# Patient Record
Sex: Male | Born: 1975 | Race: White | Hispanic: Yes | Marital: Married | Smoking: Never smoker
Health system: Southern US, Community
[De-identification: ages and names within clinical notes are randomized; demographics above are authoritative.]

## PROBLEM LIST (undated history)

## (undated) HISTORY — PX: NO PAST SURGERIES: SHX2092

---

## 2014-10-27 ENCOUNTER — Emergency Department (INDEPENDENT_AMBULATORY_CARE_PROVIDER_SITE_OTHER)
Admission: EM | Admit: 2014-10-27 | Discharge: 2014-10-27 | Disposition: A | Discharge status: Home / Self Care | Payer: Self-pay | Source: Home / Self Care | Attending: Family Medicine | Admitting: Family Medicine

## 2014-10-27 ENCOUNTER — Encounter (HOSPITAL_COMMUNITY): Payer: Self-pay | Admitting: Emergency Medicine

## 2014-10-27 DIAGNOSIS — G44219 Episodic tension-type headache, not intractable: Secondary | ICD-10-CM

## 2014-10-27 MED ORDER — NAPROXEN 375 MG PO TABS: 375.0000 mg | ORAL_TABLET | Freq: Two times a day (BID) | ORAL | Status: DC

## 2014-10-27 NOTE — Discharge Instructions (Signed)
Dolor de Pensions consultant, preguntas frecuentes y sus respuestas (Headaches, Frequently Asked Questions) CEFALEAS MIGRAOSAS P: Qu es la migraa? Qu la ocasiona? Cmo puedo tratarla? R: En general, la migraa comienza como un dolor apagado. Luego progresa hacia un dolor, constante, punzante y como un latido. Sentir Copy las sienes. Podr sentir Aeronautical engineer parte anterior o posterior de la cabeza, o en uno o ambos lados. El dolor suele estar acompaado de una combinacin de:  Nuseas.  Vmitos.  Sensibilidad a la luz y los ruidos. Algunas personas (un 15%) experimentan un aura (ver abajo) antes de un ataque. La causa de la migraa se debe a reacciones qumicas del cerebro. El tratamiento para la migraa puede incluir medicamentos de [place]. Tambin puede incluir tcnicas de [place]. Estas incluyen entrenamientos para la relajacin y biorretroalimentacin.  P: Qu es un aura? R: Alrededor del 15% de las personas con migraa tiene un "aura". Es una seal de sntomas neurolgicos que ocurren antes de un dolor de cabeza por migraas. Podr ver lneas onduladas o irregulares, puntos o luces parpadeantes. Podr experimentar visin de tnel o puntos ciegos en uno o ambos ojos. El aura puede incluir alucinaciones visuales o auditivas (algo que se imagina). Puede incluir trastornos en el olfato (como olores extraos), el tacto o el gusto. Entre otros sntomas se incluyen:  Adormecimiento.  Sensacin de hormigueo.  Dificultad para recordar o Tax adviser. Estos episodios neurolgicos pueden durar hasta 60 minutos. Los sntomas desaparecern a medida que el dolor de cabeza comience. P:Qu es un disparador? R: Ciertos factores fsicos o Best boy a "disparar" una migraa. Estos son:  Alimentos.  Cambios hormonales.  Clima.  Estrs. Es importante recordar que los disparadores son diferentes entre si. Para ayudar a prevenir ataques de migraas, necesitar  descubrir cules son los Engineer, civil (consulting). Lleve un diario sobre sus dolores de [place]. Este es un buen modo para descubrir los disparadores. El Visual merchandiser en el momento de hablar con el profesional acerca de su enfermedad. P: El clima afecta en las migraas? R: La luz solar, el calor, la humedad y lo cambios drsticos en la presin Doctor, hospital a, o "disparar" un ataque de migraa en [employer]. Pero estudios han demostrado que el clima no acta como disparador para todas las personas con [place]. P: Cul es la relacin entre la migraa y la hormonas? R: Las hormonas inician y [place] funciones corporales. Las hormonas [employer] balance en el cuerpo dentro de los constantes cambios de [place]. Algunas veces, el nivel de hormonas en el cuerpo se desbalancea. Por ejemplo, durante la menstruacin, el embarazo o la [place]. Pueden ser la causa de un ataque de migraa. De hecho, alrededor de tres cuartos de las mujeres con migraa informan que sus ataques estn relacionados con el ciclo menstrual.  P: Aumenta el riesgo de sufrir un choque cardaco en las personas que padecen migraa? R: La probabilidad de que un ataque de migraa ocasione un ataque cardaco es muy remota. Esto no quiere Google persona que sufre de migraa no pueda tener un ataque cardaco asociado con ella. En las personas menores de 40 aos, el factor ms comn para un ataque es la [place]. Pero durante la vida de una persona, la ocurrencia de un dolor de cabeza por migraa est asociada con una reduccin en el riesgo de morir por un ataque cerebrovascular.  P: Cules son los medicamentos para la migraa? R: La  medicación precisa se utiliza para tratar el dolor de cabeza una vez que ha comenzado. Son ejemplos, medicamentos de venta libre, desinflamatorios sin esteroides, ergotamínicos y triptanos.  °P: ¿Qué son los triptanos? °R: Lo triptanos son una nueva clase de  medicamentos abortivos. Son específicos para tratar este problema. Los triptanos son vasoconstrictores. Moderan algunas reacciones químicas del cerebro. Los triptanos trabajan como receptores del cerebro. Ayudan a restaurar el balance de un neurotransmisor denominado serotonina. Se cree que las fluctuaciones en los niveles de serotonina son la causa principal de la migraña.  °P: ¿Son efectivos los medicamentos de venta libre para la migraña? °R: Los medicamentos de venta libre pueden ser efectivos para aliviar dolores leves a moderados y los síntomas asociados a la migraña. Pero deberá consultar a un médico antes de comenzar cualquier tratamiento para la migraña.  °P: ¿Cuáles son los medicamentos de prevención de la migraña? °R: Se suele denominar tratamiento "profiláctico" a los medicamentos para la prevención de la migraña. Se utilizan para reducir la frecuencia, gravedad y duración de los ataques de migraña. Son ejemplos de medicamentos de prevención: antiepilépticos, antidepresivos, bloqueadores beta, bloqueadores de los canales de calcio y medicamentos antiinflamatorios sin esteroides. °P: ¿ Por qué se utilizan anticonvulsivantes para tratar la migraña? °R: Durante los últimos años, ha habido un creciente interés en las drogas antiepilépticas para la prevención de la migraña. A menudo se los conoce como "anticonvulsivantes". La epilepsia y la migraña suceden por reacciones similares en el cerebro.  °P: ¿ Por qué se utilizan antidepresivos para tratar la migraña? °R: Los antidepresivos típicamente se utilizan para tratar a las personas con depresión. Pueden reducir la frecuencia de la migraña a través de la regulación de los niveles químicos, como la serotonina, en el cerebro.  °P: ¿ Por qué se utilizan terapias alternativas para tratar la migraña? °R: El término "terapias alternativas" suelen utilizarse para describir los tratamientos que se considera que están por fuera de alcance la medicina occidental  convencional. Son ejemplos de las terapias alternativas: la acupuntura, la acupresión y el yoga. Otra terapia alternativa común es la terapia herbal. Se cree que algunas hierbas ayudan a aliviar los dolores de cabeza. Siempre consulte con el profesional acerca de las terapias alternativas antes de utilizarlas. Algunos productos herbales contienen arsénico y otras toxinas. °DOLORES DE CABEZA POR TENSIÓN °P: ¿Qué es un dolor de cabeza por tensión? ¿Qué lo ocasiona? ¿Cómo puedo tratarlo? °R: Los dolores de cabeza por tensión ocurren al azar. A menudo son el resultado de estrés temporario, ansiedad, fatiga o ira. Los síntomas incluyen dolor en las sienes, una sensación como de tener una banda alrededor de la cabeza (un dolor que "presiona"). Los síntomas pueden incluir una sensación de empuje, de presión y contracción de los músculos de la cabeza y el cuello. El dolor comienza en la frente, sienes o en la parte posterior de la cabeza y el cuello. El tratamiento para los dolores de cabeza por tensión puede incluir medicamentos de venta libre. También puede incluir técnicas de autoayuda con entrenamientos para la relajación y biorretroalimentación. °CEFALEA EN RACIMOS °P: ¿Qué es una cefalea en racimos? ¿Qué la ocasiona? ¿Cómo puedo tratarla? °R: La cefalea en racimos toma su nombre debido a que los ataques vienen en grupos. El dolor aparece con poco o ningún aviso. Normalmente ocurre de un lado de la cabeza. Muchas veces el dolor viene acompañado de un lagrimeo u ojo rojo y goteo de la nariz del mismo lado que el dolor. Se cree que la causa es   una reacción en las sustancias químicas del cerebro. Se describe como el caso más grave e intenso de cualquier tipo de dolor de cabeza. El tratamiento incluye medicamentos bajo receta y oxígeno. °CEFALEA SINUSAL °P: ¿Qué es una cefalea sinusal? ¿Qué la ocasiona? ¿Cómo puedo tratarla? °R: Cuando se inflama una cavidad en los huesos de la cara y el cráneo (sinus) ocasiona un dolor  localizado. Esta enfermedad generalmente es el resultado de una reacción alérgica, un tumor o una infección. Si el dolor de cabeza está ocasionado por un bloqueo del sinus, como una infección, probablemente tendrá fiebre. Una imagen de rayos X confirmará el bloqueo del sinus. El tratamiento indicado por el médico podrá incluir antibióticos para la infección, y también antihistamínicos o descongestivos.  °DOLOR DE CABEZA POR EFECTO "REBOTE" °P: ¿Qué es un dolor de cabeza por efecto "rebote"? ¿Qué lo ocasiona? ¿Cómo puedo tratarlo? °R: Si se toman medicamentos para el dolor de cabeza muy a menudo puede llevar a la enfermedad conocida como "dolor de cabeza por rebote". Un patrón de abuso de medicamentos para el dolor de cabeza supone tomarlos más de dos veces por semana o en cantidades excesivas. Esto significa más que lo que indica el envase o el médico. Con los dolores de cabeza por rebote, los medicamentos no sólo dejan de aliviar el dolor sino que además comienzan a ocasionar dolores de cabeza. Los médicos tratan los dolores de cabeza por rebote mediante la disminución del medicamento del que se ha abusado. A veces el medico podrá sustituir gradualmente por un tipo diferente de tratamiento o medicación. Dejar de consumirlo podría ser difícil. El abuso regular de un medicamento aumenta el potencial que se produzcan efectos secundarios graves. Consulte con un médico si utiliza regularmente medicamentos para el dolor de cabeza más de dos días por semana o más de lo que indica el envase. °PREGUNTAS Y RESPUESTAS ADICIONALES °P: ¿Qué es la biorretroalimentación? °R: La biorretroalimentación es un tratamiento de autoayuda. La biorretroalimentación utiliza un equipamiento especial para controlar los movimientos involuntarios del cuerpo y las respuestas físicas. La biorretroalimentación controla: °· Respiración. °· Pulso. °· Latidos cardíacos. °· Temperatura. °· Tensión muscular. °· Actividad cerebrales. °La  biorretroalimentación le ayudará a mejorar y perfeccionar sus ejercicios de relajación. Aprenderá a controlar las respuestas físicas relacionadas con el estrés. Una vez que se dominan las técnicas no necesitará más el equipamiento. °P: ¿Son hereditarios los dolores de cabeza? °R: Según algunas estimaciones, aproximadamente 28 millones de estadounidenses sufren migraña. Cuatro de cada cinco (80%) informan una historia familiar de migraña. Los investigadores no pueden asegurar si se trata de un problema genético o una predisposición familiar. A pesar de esto, un niño tiene 50% de probabilidades de sufrir migraña si uno de sus padres la sufre. El niño tiene un 75% de probabilidades si ambos padres la sufren.  °P. ¿Puede un niño tener migraña? °R: En el momento de ingresar a la escuela secundaria, la mayoría de los jóvenes han experimentado algún tipo de cefalea. Algunos abordajes o medicamentos seguros y efectivos pueden evitar las cefaleas o detenerlas luego de que han comenzado.  °P. ¿Qué tipo de especialista debe ver para diagnosticar y tratar una cefalea? °R: Comience con su médico de cabecera. Converse acerca de su experiencia y abordaje de las cefaleas. Comente los métodos de clasificación, diagnóstico y tratamiento. El profesional decidirá si lo derivará a un especialista, según los síntomas u otras enfermedades. El hecho de sufrir diabetes, alergias, etc, puede requerir un abordaje más complejo. La National Headache Foundation (Fundación Nacional   para las [address]as) proporcionar, a pedido, Agricultural engineeruna lista de los mdicos que son miembros de [place]. Document Released: 08/31/2008 Document Revised: 12/11/2011 [facility] Patient Information 2015 [place], [place]. This information is not intended to replace advice given to you by your health care provider. Make sure you discuss any questions you have with your health care provider.  Cefalea tensional  (Tension Headache)  Una cefalea tensional es una sensacin de  dolor y opresin en la frente y los lados de la cabeza. El dolor puede ser sordo o puede sentirse que comprime (constrictivo). Este es el tipo ms comn de dolor de Turkmenistancabeza. Generalmente no se asocian con nuseas o vmitos y no empeoran con la actividad fsica. Pueden durar desde 30 minutos a varios das.  CAUSAS  Se desconoce la causa exacta, pero puede ser causada por las sustancias qumicas y las hormonas del cerebro que producen dolor. Suelen comenzar despus de una situacin de estrs, ansiedad o por depresin. Otros desencadenantes pueden ser:   El alcohol.  Cafena (demasiada o abstinencia).  Infecciones respiratorias (resfriados, gripes, sinusitis).  Problemas dentales o apretar los dientes.  Fatiga.  Mantener la cabeza y el cuello en una posicin demasiado tiempo mientras utiliza un ordenador. SNTOMAS   Presin alrededor de la cabeza.   Dolor "sordo" en la cabeza.   Dolor que siente sobre la frente y los lados de la cabeza.   Sensibilidad en los msculos de la cabeza, del cuello y de los hombros. DIAGNSTICO  El diagnstico se realiza en base a:   Sntomas.   Examen fsico.   Neomia DearUna TC (tomografa computada) o resonancia magntica de la cabeza. Se pueden pedir estas pruebas, si los sntomas son severos o inusuales. TRATAMIENTO  Le recetarn medicamentos que ayudan a Asbury Automotive Groupaliviar los sntomas.  INSTRUCCIONES PARA EL CUIDADO EN EL HOGAR   Slo tome medicamentos de venta libre o recetados para Primary school teachercalmar el dolor o Environmental health practitionerel malestar, segn las indicaciones de su mdico.   Cuando sienta dolor de cabeza acustese en un cuarto oscuro y tranquilo.   Lleve un registro diario para Financial risk analystaveriguar lo que Southern Companypuede desencadenar los dolores de Turkmenistancabeza. Por ejemplo, escriba:  Lo que come y bebe.  Cunto tiempo duerme.  Todo cambio en la dieta o medicamentos.  Trate con masajes u otras tcnicas de relajacin.   Pueden utilizarse bolsas de hielo o calor aplicadas a la cabeza o al cuello.  selos 3 a 4 veces por da de 15 a 20 minutos por vez, o como sea necesario.   Limite las situaciones de estrs.   Sintese con la espalda recta y no tense los msculos.   Si fuma, deje de hacerlo.  Limite el consumo de bebidas alcohlicas.  Consuma menos cantidad de cafena o deje de tomarla.  Coma y haga ejercicios regularmente.  Duerma entre 7 y 9 horas o como lo indique su mdico.  Evite el uso excesivo de medicamentos para Investment banker, operationalel dolor como el dolor recurrente de cabeza que pueden ocurrir.  SOLICITE ATENCIN MDICA SI:   Tiene problemas con los Arboriculturistmedicamentos que le recetaron.  El medicamento no le hace efecto.  El dolor de cabeza que senta habitualmente es diferente.  Tiene nuseas o vmitos. SOLICITE ATENCIN MDICA DE INMEDIATO SI:   El dolor se hace cada vez ms intenso.  Tiene fiebre.  Presenta rigidez en el cuello.  Sufre prdida de la visin.  Presenta debilidad muscular o prdida del control muscular.  Pierde equilibrio o tiene problemas para Advertising account plannercaminar.  Sufre mareos o  se desmaya.  Tiene sntomas graves que son diferentes a los primeros sntomas. ASEGRESE DE QUE:   Comprende estas instrucciones.  Controlar su enfermedad.  Solicitar ayuda de inmediato si no mejora o si empeora. Document Released: 06/28/2005 Document Revised: 12/11/2011 [facility] Patient Information 2015 [place], [place]. This information is not intended to replace advice given to you by your health care provider. Make sure you discuss any questions you have with your health care provider.

## 2014-10-27 NOTE — ED Provider Notes (Signed)
CSN: [id number]     Arrival date & time 10/27/14  4098 History   First MD Initiated Contact with Patient 10/27/14 1004     Chief Complaint  Patient presents with  . Headache   (Consider location/radiation/quality/duration/timing/severity/associated sxs/prior Treatment) HPI Comments: Spouse states she was concerned that patient might have high blood pressure. She states that on occasion, when patient is having a headache, they will check his BP at automated machine at [facility] and will occasionally find it to be elevated. Reports himself to be symptom free at time of today's exam.  PCP: none Works in Building surveyor  Patient is a 39 y.o. male presenting with headaches. The history is provided by the patient and the spouse. The history is limited by a language barrier. A language interpreter was used.  Headache Pain location:  L temporal and R temporal Quality:  Dull (throbbing) Severity currently:  0/10 Duration:  8 days Timing:  Intermittent Progression:  Waxing and waning Chronicity:  Recurrent Similar to prior headaches: yes   Associated symptoms: sinus pressure   Associated symptoms: no abdominal pain, no back pain, no blurred vision, no congestion, no cough, no diarrhea, no dizziness, no drainage, no ear pain, no pain, no facial pain, no fatigue, no fever, no focal weakness, no hearing loss, no loss of balance, no myalgias, no nausea, no near-syncope, no neck pain, no neck stiffness, no numbness, no paresthesias, no photophobia, no seizures, no sore throat, no swollen glands, no syncope, no tingling, no URI, no visual change, no vomiting and no weakness     History reviewed. No pertinent past medical history. History reviewed. No pertinent past surgical history. History reviewed. No pertinent family history. History  Substance Use Topics  . Smoking status: Never Smoker   . Smokeless tobacco: Not on file  . Alcohol Use: No    Review of Systems  Constitutional:  Negative for fever and fatigue.  HENT: Positive for sinus pressure. Negative for congestion, ear discharge, ear pain, hearing loss, nosebleeds, postnasal drip, rhinorrhea and sore throat.   Eyes: Negative for blurred vision, photophobia, pain and visual disturbance.  Respiratory: Negative for cough, chest tightness and shortness of breath.   Cardiovascular: Negative.  Negative for syncope and near-syncope.  Gastrointestinal: Negative for nausea, vomiting, abdominal pain and diarrhea.  Musculoskeletal: Negative for myalgias, back pain, arthralgias, neck pain and neck stiffness.  Skin: Negative.   Neurological: Positive for headaches. Negative for dizziness, tremors, focal weakness, seizures, syncope, facial asymmetry, speech difficulty, weakness, light-headedness, numbness, paresthesias and loss of balance.    Allergies  Review of patient's allergies indicates no known allergies.  Home Medications   Prior to Admission medications   Medication Sig Start Date End Date Taking? Authorizing Provider  naproxen (NAPROSYN) 375 MG tablet Take 1 tablet (375 mg total) by mouth 2 (two) times daily with a meal. As needed for headaches 10/27/14   Jess Barters H Clements Toro, PA   BP 131/84 mmHg  Pulse 57  Temp(Src) 98.3 F (36.8 C) (Oral)  Resp 16  SpO2 96% Physical Exam  Constitutional: He is oriented to person, place, and time. He appears well-developed and well-nourished.  HENT:  Head: Normocephalic and atraumatic.  Right Ear: External ear normal.  Left Ear: External ear normal.  Nose: Nose normal.  Mouth/Throat: Oropharynx is clear and moist.  Eyes: Conjunctivae, EOM and lids are normal. Pupils are equal, round, and reactive to light.  Fundoscopic exam:      The right eye shows no  AV nicking, no hemorrhage and no papilledema.       The left eye shows no AV nicking, no hemorrhage and no papilledema.  Neck: Normal range of motion. Neck supple.  Cardiovascular: Normal rate, regular rhythm and  normal heart sounds.   Pulmonary/Chest: Effort normal and breath sounds normal.  Abdominal: Soft. Bowel sounds are normal. He exhibits no distension. There is no tenderness.  Musculoskeletal: Normal range of motion.  Neurological: He is alert and oriented to person, place, and time. He has normal strength. No cranial nerve deficit or sensory deficit. Coordination and gait normal. GCS eye subscore is 4. GCS verbal subscore is 5. GCS motor subscore is 6.  Reflex Scores:      Patellar reflexes are 2+ on the right side and 2+ on the left side. Skin: Skin is warm and dry. No rash noted. No erythema.  Psychiatric: He has a normal mood and affect. His behavior is normal.  Nursing note and vitals reviewed.   ED Course  Procedures (including critical care time) Labs Review Labs Reviewed - No data to display  Imaging Review No results found.   MDM   1. Episodic tension-type headache, not intractable    Exam without worrisome neurological deficits. Headaches are not associated with transient neurological deficits, nausea, vomiting, changes in vision, speech or balance. Is not awakened from sleep by headaches.  Hx and exam suggest bitemporal tension type headaches. Will suggest the use of either tylenol or ibuprofen when symptoms occur and [facility].    Ria ClockJennifer Lee H Caelynn Marshman, GeorgiaPA 10/27/14 1120

## 2014-10-27 NOTE — ED Notes (Signed)
Reports having a headache since last Tuesday.  C/o having pressure in the front and back of the head.  States that when heat is on in car feels sweaty, becomes pale, wife states "he starts to feel faint".  Denies n/v.  No hx of migraines.  No visual changes.    Mild relief with taking ibuprofen.   No runny nose or cough.

## 2015-08-24 ENCOUNTER — Ambulatory Visit: Payer: Self-pay | Admitting: Family Medicine

## 2022-01-15 ENCOUNTER — Encounter (HOSPITAL_COMMUNITY): Payer: Self-pay | Admitting: Emergency Medicine

## 2022-01-15 ENCOUNTER — Emergency Department (HOSPITAL_COMMUNITY): Payer: Self-pay

## 2022-01-15 ENCOUNTER — Emergency Department (HOSPITAL_COMMUNITY)
Admission: EM | Admit: 2022-01-15 | Discharge: 2022-01-15 | Disposition: A | Discharge status: Home / Self Care | Payer: Self-pay | Attending: Emergency Medicine | Admitting: Emergency Medicine

## 2022-01-15 DIAGNOSIS — K429 Umbilical hernia without obstruction or gangrene: Secondary | ICD-10-CM | POA: Insufficient documentation

## 2022-01-15 LAB — CBC WITH DIFFERENTIAL/PLATELET
Abs Immature Granulocytes: 0.02 10*3/uL (ref 0.00–0.07)
Basophils Absolute: 0.1 10*3/uL (ref 0.0–0.1)
Basophils Relative: 1 %
Eosinophils Absolute: 0.5 10*3/uL (ref 0.0–0.5)
Eosinophils Relative: 5 %
HCT: 45.9 % (ref 39.0–52.0)
Hemoglobin: 16.2 g/dL (ref 13.0–17.0)
Immature Granulocytes: 0 %
Lymphocytes Relative: 35 %
Lymphs Abs: 3 10*3/uL (ref 0.7–4.0)
MCH: 30.4 pg (ref 26.0–34.0)
MCHC: 35.3 g/dL (ref 30.0–36.0)
MCV: 86.1 fL (ref 80.0–100.0)
Monocytes Absolute: 0.8 10*3/uL (ref 0.1–1.0)
Monocytes Relative: 9 %
Neutro Abs: 4.2 10*3/uL (ref 1.7–7.7)
Neutrophils Relative %: 50 %
Platelets: 309 10*3/uL (ref 150–400)
RBC: 5.33 MIL/uL (ref 4.22–5.81)
RDW: 13 % (ref 11.5–15.5)
WBC: 8.5 10*3/uL (ref 4.0–10.5)
nRBC: 0 % (ref 0.0–0.2)

## 2022-01-15 LAB — BASIC METABOLIC PANEL
Anion gap: 5 (ref 5–15)
BUN: 21 mg/dL — ABNORMAL HIGH (ref 6–20)
CO2: 25 mmol/L (ref 22–32)
Calcium: 9.1 mg/dL (ref 8.9–10.3)
Chloride: 109 mmol/L (ref 98–111)
Creatinine, Ser: 0.94 mg/dL (ref 0.61–1.24)
GFR, Estimated: >60 mL/min (ref >60)
Glucose, Bld: 83 mg/dL (ref 70–99)
Potassium: 3.7 mmol/L (ref 3.5–5.1)
Sodium: 139 mmol/L (ref 135–145)

## 2022-01-15 MED ORDER — LACTATED RINGERS IV SOLN: INTRAVENOUS | Status: DC

## 2022-01-15 MED ORDER — OXYCODONE-ACETAMINOPHEN 5-325 MG PO TABS
2.0000 | ORAL_TABLET | Freq: Once | ORAL | Status: AC
  Administered 2022-01-15: 2 via ORAL
  Filled 2022-01-15: qty 2

## 2022-01-15 MED ORDER — SODIUM CHLORIDE (PF) 0.9 % IJ SOLN
INTRAMUSCULAR | Status: AC
  Filled 2022-01-15: qty 50

## 2022-01-15 MED ORDER — OXYCODONE-ACETAMINOPHEN 5-325 MG PO TABS: 1.0000 | ORAL_TABLET | Freq: Four times a day (QID) | ORAL | 0 refills | Status: DC | PRN

## 2022-01-15 MED ORDER — IOHEXOL 300 MG/ML  SOLN
100.0000 mL | Freq: Once | INTRAMUSCULAR | Status: AC | PRN
  Administered 2022-01-15: 100 mL via INTRAVENOUS

## 2022-01-15 NOTE — ED Provider Triage Note (Signed)
Emergency Medicine Provider Triage Evaluation Note ? ?David Mckinney , a 46 y.o. male  was evaluated in triage.  Pt complains of a painful umbilical henia ? ?Review of Systems  ?Positive: Pain in abdomen ?Negative: No fever  ? ?Physical Exam  ?BP 121/86 (BP Location: Right Arm)   Pulse 71   Temp 98.4 ?F (36.9 ?C) (Oral)   Resp 18   SpO2 96%  ?Gen:   Awake, no distress   ?Resp:  Normal effort  ?MSK:   Moves extremities without difficulty  ?Other:  Abdomen  tender firm hernia at umbilicus  ? ?Medical Decision Making  ?Medically screening exam initiated at 3:40 PM.  Appropriate orders placed.  David Mckinney was informed that the remainder of the evaluation will be completed by another provider, this initial triage assessment does not replace that evaluation, and the importance of remaining in the ED until their evaluation is complete. ? ?I asked that pt be roomed to a stretch bed, ice pack to area  ?  ?Elson Areas, [place] ?01/15/22 1541 ? ?

## 2022-01-15 NOTE — ED Provider Notes (Signed)
?[facility]-EMERGENCY DEPT ?Provider Note ? ? ?CSN: [id number] ?Arrival date & time: 01/15/22  1457 ? ?  ? ?History ? ?Chief Complaint  ?Patient presents with  ? Abdominal Pain  ? ? ?Eliceo Gladu Ackers is a 46 y.o. male. ? ?46 year old male presents with umbilical pain x3 days.  Denies any associated emesis or fever.  States normally he can push in his hernia but cannot do it currently.  Complains of severe pain that is worse with any movement.  No prior surgical history in that area.  An interpreter was used for this encounter ? ? ?  ? ?Home Medications ?Prior to Admission medications   ?Medication Sig Start Date End Date Taking? Authorizing Provider  ?naproxen (NAPROSYN) 375 MG tablet Take 1 tablet (375 mg total) by mouth 2 (two) times daily with a meal. As needed for headaches 10/27/14   Presson, Mathis Fare, PA  ?   ? ?Allergies    ?Patient has no known allergies.   ? ?Review of Systems   ?Review of Systems  ?All other systems reviewed and are negative. ? ?Physical Exam ?Updated Vital Signs ?BP 121/86 (BP Location: Right Arm)   Pulse 71   Temp 98.4 ?F (36.9 ?C) (Oral)   Resp 18   SpO2 96%  ?Physical Exam ?Vitals and nursing note reviewed.  ?Constitutional:   ?   General: He is not in acute distress. ?   Appearance: Normal appearance. He is well-developed. He is not toxic-appearing.  ?HENT:  ?   Head: Normocephalic and atraumatic.  ?Eyes:  ?   General: Lids are normal.  ?   Conjunctiva/sclera: Conjunctivae normal.  ?   Pupils: Pupils are equal, round, and reactive to light.  ?Neck:  ?   Thyroid: No thyroid mass.  ?   Trachea: No tracheal deviation.  ?Cardiovascular:  ?   Rate and Rhythm: Normal rate and regular rhythm.  ?   Heart sounds: Normal heart sounds. No murmur heard. ?  No gallop.  ?Pulmonary:  ?   Effort: Pulmonary effort is normal. No respiratory distress.  ?   Breath sounds: Normal breath sounds. No stridor. No decreased breath sounds, wheezing, rhonchi or rales.   ?Abdominal:  ?   General: There is no distension.  ?   Palpations: Abdomen is soft.  ?   Tenderness: There is no abdominal tenderness. There is no rebound.  ? ? ?Musculoskeletal:     ?   General: No tenderness. Normal range of motion.  ?   Cervical back: Normal range of motion and neck supple.  ?Skin: ?   General: Skin is warm and dry.  ?   Findings: No abrasion or rash.  ?Neurological:  ?   Mental Status: He is alert and oriented to person, place, and time. Mental status is at baseline.  ?   GCS: GCS eye subscore is 4. GCS verbal subscore is 5. GCS motor subscore is 6.  ?   Cranial Nerves: No cranial nerve deficit.  ?   Sensory: No sensory deficit.  ?   Motor: Motor function is intact.  ?Psychiatric:     ?   Attention and Perception: Attention normal.     ?   Speech: Speech normal.     ?   Behavior: Behavior normal.  ? ? ?ED Results / Procedures / Treatments   ?Labs ?(all labs ordered are listed, but only abnormal results are displayed) ?Labs Reviewed - No data to display ? ?EKG ?None ? ?  Radiology ?No results found. ? ?Procedures ?Procedures  ? ? ?Medications Ordered in ED ?Medications - No data to display ? ?ED Course/ Medical Decision Making/ A&P ?  ?                        ?Medical Decision Making ?Amount and/or Complexity of Data Reviewed ?Labs: ordered. ?Radiology: ordered. ? ?Risk ?Prescription drug management. ? ? ?Attempted to manually reduce hernia without success.  CT scan shows an umbilical hernia containing fat.  No evidence of bowel obstruction.  Discussed with Dr. Cliffton Asters from general surgery.  He will see the patient in the office this week. ? ? ? ? ? ? ? ?Final Clinical Impression(s) / ED Diagnoses ?Final diagnoses:  ?None  ? ? ?Rx / DC Orders ?ED Discharge Orders   ? ? None  ? ?  ? ? ?  ?Lorre Nick, MD ?01/15/22 2112 ? ?

## 2022-01-15 NOTE — ED Notes (Signed)
Ice pack provided to patient per PA request. ?

## 2022-01-15 NOTE — ED Triage Notes (Signed)
Patient c/o pain at belly button since last night. Denies N/V/D. States he is concerned for hernia.  ? ?Triage completed using interpreter Violet [phone]. ?

## 2022-01-15 NOTE — ED Notes (Signed)
Patient currently in CT °

## 2022-02-10 ENCOUNTER — Ambulatory Visit: Payer: Self-pay | Admitting: Surgery

## 2022-02-10 NOTE — H&P (Signed)
David Mckinney [id number]    Referring Provider:  Room, Emergency     Subjective    Chief Complaint: New Consultation (Umb. Hernia )       History of Present Illness:    Exceptionally pleasant 45-year-old male with no known medical problems who was referred by the emergency department after presenting with a 3-day history of umbilical pain in mid April.  No associated emesis or fever.  Note umbilical hernia which was typically reducible but had become incarcerated at that time.  Pain worse with movement.  No previous abdominal surgery.  He did have a CT scan confirming a fat-containing incarcerated umbilical hernia with a narrow fascial defect about 8 mm.  Incidentally noted hepatic steatosis and simple hepatic and renal cysts, colonic diverticulosis. He states his hernia has been present for about 15 years, but has never bothered him until now.  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     Review of Systems: A complete review of systems was obtained from the patient.  I have reviewed this information and discussed as appropriate with the patient.  See HPI as well for other ROS.     Medical History: History reviewed. No pertinent past medical history.   There is no problem list on file for this patient.     History reviewed. No pertinent surgical history.    No Known Allergies   No current outpatient medications on file prior to visit.    No current facility-administered medications on file prior to visit.      No family history on file.    Social History       Tobacco Use  Smoking Status Never  Smokeless Tobacco Never      Social History        Socioeconomic History   Marital status: Married  Tobacco Use   Smoking status: Never   Smokeless tobacco: Never  Substance and Sexual Activity   Alcohol use: Not Currently   Drug use: Never      Objective:          Vitals:    02/10/22 1544  BP: 120/70  Pulse: 71  Temp: 36.2 C (97.2 F)  SpO2: 98%  Weight: 75 kg (165 lb 6.4 oz)  Height: 156.2 cm (5' 1.5")    Body mass index is 30.75 kg/m.   A&Ox3 Unlabored respirations Abdomen soft and nontender.  There is a partially reducible but essentially incarcerated fat-containing umbilical hernia.  This is mildly tender.  Fascial defect is not palpable.   Assessment and Plan:  Diagnoses and all orders for this visit:   Umbilical hernia without obstruction and without gangrene     Recommended open repair, discussed technique of surgery and risks of bleeding, infection, pain, scarring, injury to intra-abdominal structures, wound healing problems, hematoma/seroma, hernia recurrence, possible use of mesh, as well as systemic cardiovascular/pulmonary/thromboembolic complications.  Discussed 6 weeks of restricted lifting postop.  Questions welcomed and answered.  We will schedule the patient's convenience.     David Mckinney David Maxwell Lemen, MD   

## 2022-03-03 NOTE — Patient Instructions (Addendum)
DUE TO COVID-19 ONLY TWO VISITORS  (aged 47 and older)  ARE ALLOWED TO COME WITH YOU AND STAY IN THE WAITING ROOM ONLY DURING PRE OP AND PROCEDURE.    **NO VISITORS ARE ALLOWED IN THE SHORT STAY AREA OR RECOVERY ROOM!!**   IF YOU WILL BE ADMITTED INTO THE HOSPITAL YOU ARE ALLOWED ONLY FOUR SUPPORT PEOPLE DURING VISITATION HOURS ONLY (7 AM -8PM)   The support person(s) must pass our screening, gel in and out, and wear a mask at all times, including in the patient's room. Patients must also wear a mask when staff or their support person are in the room. Visitors GUEST BADGE MUST BE WORN VISIBLY  One adult visitor may remain with you overnight and MUST be in the room by 8 P.M.     Your procedure is scheduled on: 03/17/22   Report to [facility] Main Entrance    Report to admitting at   6:45 AM   Call this number if you have problems the morning of surgery [phone]     Do not eat food or drink :After Midnight.             If you have questions, please contact your surgeon's office.   Oral Hygiene is also important to reduce your risk of infection.                                    Remember - BRUSH YOUR TEETH THE MORNING OF SURGERY WITH YOUR REGULAR TOOTHPASTE   Do NOT smoke after Midnight   Take these medicines the morning of surgery with A SIP OF WATER: none                                You may not have any metal on your body including  jewelry, and body piercing             Do not wear lotions, powders, cologne, or deodorant              Men may shave face and neck.   Do not bring valuables to the hospital. [facility] IS NOT             RESPONSIBLE   FOR VALUABLES.   Contacts, dentures or bridgework may not be worn into surgery.      Patients discharged on the day of surgery will not be allowed to drive home.  Someone NEEDS to stay with you for the first 24 hours after anesthesia.                 Please read over the following fact sheets you were  given:   IF YOU HAVE QUESTIONS ABOUT YOUR PRE-OP INSTRUCTIONS PLEASE CALL [phone]       [facility] - Preparing for Surgery Before surgery, you can play an important role.  Because skin is not sterile, your skin needs to be as free of germs as possible.  You can reduce the number of germs on your skin by washing with CHG (chlorahexidine gluconate) soap before surgery.  CHG is an antiseptic cleaner which kills germs and bonds with the skin to continue killing germs even after washing. Please DO NOT use if you have an allergy to CHG or antibacterial soaps.  If your skin becomes reddened/irritated stop using the CHG and inform your  nurse when you arrive at Short Stay. You may shave your face/neck. Please follow these instructions carefully:  1.  Shower with CHG Soap the night before surgery and the  morning of Surgery.  2.  If you choose to wash your hair, wash your hair first as usual with your  normal  shampoo.  3.  After you shampoo, rinse your hair and body thoroughly to remove the  shampoo.                            4.  Use CHG as you would any other liquid soap.  You can apply chg directly  to the skin and wash                       Gently with a scrungie or clean washcloth.  5.  Apply the CHG Soap to your body ONLY FROM THE NECK DOWN.   Do not use on face/ open                           Wound or open sores. Avoid contact with eyes, ears mouth and genitals (private parts).                       Wash face,  Genitals (private parts) with your normal soap.             6.  Wash thoroughly, paying special attention to the area where your surgery  will be performed.  7.  Thoroughly rinse your body with warm water from the neck down.  8.  DO NOT shower/wash with your normal soap after using and rinsing off  the CHG Soap.             9.  Pat yourself dry with a clean towel.            10.  Wear clean pajamas.            11.  Place clean sheets on your bed the night of your first shower and do  not  sleep with pets.  Day of Surgery : Do not apply any lotions/deodorants the morning of surgery.  Please wear clean clothes to the hospital/surgery center.     FAILURE TO FOLLOW THESE INSTRUCTIONS MAY RESULT IN THE CANCELLATION OF YOUR SURGERY    ________________________________________________________________________

## 2022-03-07 ENCOUNTER — Encounter (HOSPITAL_COMMUNITY): Payer: Self-pay

## 2022-03-07 ENCOUNTER — Other Ambulatory Visit: Payer: Self-pay

## 2022-03-07 ENCOUNTER — Encounter (HOSPITAL_COMMUNITY)
Admission: RE | Admit: 2022-03-07 | Discharge: 2022-03-07 | Disposition: A | Discharge status: Home / Self Care | Payer: Self-pay | Source: Ambulatory Visit | Attending: Surgery | Admitting: Surgery

## 2022-03-07 DIAGNOSIS — Z01812 Encounter for preprocedural laboratory examination: Secondary | ICD-10-CM | POA: Insufficient documentation

## 2022-03-07 DIAGNOSIS — Z01818 Encounter for other preprocedural examination: Secondary | ICD-10-CM

## 2022-03-07 LAB — CBC
HCT: 43.9 % (ref 39.0–52.0)
Hemoglobin: 15.1 g/dL (ref 13.0–17.0)
MCH: 29.6 pg (ref 26.0–34.0)
MCHC: 34.4 g/dL (ref 30.0–36.0)
MCV: 86.1 fL (ref 80.0–100.0)
Platelets: 320 10*3/uL (ref 150–400)
RBC: 5.1 MIL/uL (ref 4.22–5.81)
RDW: 12.7 % (ref 11.5–15.5)
WBC: 9.6 10*3/uL (ref 4.0–10.5)
nRBC: 0 % (ref 0.0–0.2)

## 2022-03-07 NOTE — Progress Notes (Addendum)
For Short Stay: COVID SWAB appointment date: Date of COVID positive in last 90 days:  Bowel Prep reminder:   For Anesthesia: PCP - NO PCP Cardiologist -   Chest x-ray -  EKG -  Stress Test -  ECHO -  Cardiac Cath -  Pacemaker/ICD device last checked: Pacemaker orders received: Device Rep notified:  Spinal Cord Stimulator:  Sleep Study -  CPAP -   Fasting Blood Sugar -  Checks Blood Sugar _____ times a day Date and result of last Hgb A1c-  Blood Thinner Instructions: Aspirin Instructions: Last Dose:  Activity level: Can go up a flight of stairs and activities of daily living without stopping and without chest pain and/or shortness of breath   Able to exercise without chest pain and/or shortness of breath   Unable to go up a flight of stairs without chest pain and/or shortness of breath     Anesthesia review:   Patient denies shortness of breath, fever, cough and chest pain at PAT appointment   Patient verbalized understanding of instructions that were given to them at the PAT appointment. Patient was also instructed that they will need to review over the PAT instructions again at home before surgery.  

## 2022-03-10 ENCOUNTER — Other Ambulatory Visit (HOSPITAL_COMMUNITY): Payer: Self-pay

## 2022-03-16 NOTE — Anesthesia Preprocedure Evaluation (Addendum)
Anesthesia Evaluation  Patient identified by MRN, date of birth, ID band Patient awake    Reviewed: Allergy & Precautions, NPO status , Patient's Chart, lab work & pertinent test results  Airway Mallampati: I  TM Distance: >3 FB Neck ROM: Full    Dental no notable dental hx. (+) Teeth Intact, Dental Advisory Given   Pulmonary neg pulmonary ROS,    Pulmonary exam normal breath sounds clear to auscultation       Cardiovascular Exercise Tolerance: Poor Normal cardiovascular exam Rhythm:Regular Rate:Normal     Neuro/Psych negative neurological ROS     GI/Hepatic Neg liver ROS,   Endo/Other    Renal/GU      Musculoskeletal   Abdominal (+) + obese (BMI 31.99),   Peds  Hematology Lab Results      Component                Value               Date                      WBC                      9.6                 03/07/2022                HGB                      15.1                03/07/2022                HCT                      43.9                03/07/2022                MCV                      86.1                03/07/2022                PLT                      320                 03/07/2022              Anesthesia Other Findings NKA  Reproductive/Obstetrics                            Anesthesia Physical Anesthesia Plan  ASA: 1  Anesthesia Plan: General   Post-op Pain Management: Toradol IV (intra-op)* and Precedex   Induction: Intravenous  PONV Risk Score and Plan: 3 and Treatment may vary due to age or medical condition, Midazolam and Ondansetron  Airway Management Planned: LMA  Additional Equipment: None  Intra-op Plan:   Post-operative Plan:   Informed Consent: I have reviewed the patients History and Physical, chart, labs and discussed the procedure including the risks, benefits and alternatives for the proposed anesthesia with the patient or authorized representative  who has indicated his/her understanding and acceptance.  Dental advisory given and Interpreter used for interveiw  Plan Discussed with: CRNA  Anesthesia Plan Comments: (Spanish Speaking)      Anesthesia Quick Evaluation

## 2022-03-17 ENCOUNTER — Ambulatory Visit (HOSPITAL_BASED_OUTPATIENT_CLINIC_OR_DEPARTMENT_OTHER): Payer: Self-pay | Admitting: Certified Registered Nurse Anesthetist

## 2022-03-17 ENCOUNTER — Ambulatory Visit (HOSPITAL_COMMUNITY)
Admission: RE | Admit: 2022-03-17 | Discharge: 2022-03-17 | Disposition: A | Discharge status: Home / Self Care | Payer: Self-pay | Attending: Surgery | Admitting: Surgery

## 2022-03-17 ENCOUNTER — Ambulatory Visit (HOSPITAL_COMMUNITY): Payer: Self-pay | Admitting: Certified Registered Nurse Anesthetist

## 2022-03-17 ENCOUNTER — Other Ambulatory Visit: Payer: Self-pay

## 2022-03-17 ENCOUNTER — Encounter (HOSPITAL_COMMUNITY)
Admission: RE | Disposition: A | Discharge status: Home / Self Care | Payer: Self-pay | Source: Home / Self Care | Attending: Surgery

## 2022-03-17 ENCOUNTER — Encounter (HOSPITAL_COMMUNITY): Payer: Self-pay | Admitting: Surgery

## 2022-03-17 DIAGNOSIS — K420 Umbilical hernia with obstruction, without gangrene: Secondary | ICD-10-CM

## 2022-03-17 DIAGNOSIS — Z01818 Encounter for other preprocedural examination: Secondary | ICD-10-CM

## 2022-03-17 HISTORY — PX: UMBILICAL HERNIA REPAIR: SHX196

## 2022-03-17 SURGERY — REPAIR, HERNIA, UMBILICAL, ADULT
Anesthesia: General

## 2022-03-17 MED ORDER — KETOROLAC TROMETHAMINE 30 MG/ML IJ SOLN
INTRAMUSCULAR | Status: DC | PRN
  Administered 2022-03-17: 15 mg via INTRAVENOUS

## 2022-03-17 MED ORDER — DOCUSATE SODIUM 100 MG PO CAPS: 100.0000 mg | ORAL_CAPSULE | Freq: Two times a day (BID) | ORAL | 0 refills | Status: AC

## 2022-03-17 MED ORDER — DEXMEDETOMIDINE (PRECEDEX) IN NS 20 MCG/5ML (4 MCG/ML) IV SYRINGE
PREFILLED_SYRINGE | INTRAVENOUS | Status: AC
  Filled 2022-03-17: qty 10

## 2022-03-17 MED ORDER — PROPOFOL 10 MG/ML IV BOLUS
INTRAVENOUS | Status: DC | PRN
  Administered 2022-03-17: 120 mg via INTRAVENOUS

## 2022-03-17 MED ORDER — FENTANYL CITRATE (PF) 100 MCG/2ML IJ SOLN
INTRAMUSCULAR | Status: DC | PRN
  Administered 2022-03-17 (×3): 50 ug via INTRAVENOUS

## 2022-03-17 MED ORDER — OXYCODONE HCL 5 MG PO TABS: 5.0000 mg | ORAL_TABLET | Freq: Once | ORAL | Status: DC | PRN

## 2022-03-17 MED ORDER — DEXAMETHASONE SODIUM PHOSPHATE 10 MG/ML IJ SOLN
INTRAMUSCULAR | Status: DC | PRN
  Administered 2022-03-17: 5 mg via INTRAVENOUS

## 2022-03-17 MED ORDER — LACTATED RINGERS IV SOLN: INTRAVENOUS | Status: DC

## 2022-03-17 MED ORDER — SODIUM CHLORIDE 0.9% FLUSH: 3.0000 mL | INTRAVENOUS | Status: DC | PRN

## 2022-03-17 MED ORDER — CHLORHEXIDINE GLUCONATE 0.12 % MT SOLN
15.0000 mL | Freq: Once | OROMUCOSAL | Status: AC
  Administered 2022-03-17: 15 mL via OROMUCOSAL

## 2022-03-17 MED ORDER — MIDAZOLAM HCL 5 MG/5ML IJ SOLN
INTRAMUSCULAR | Status: DC | PRN
  Administered 2022-03-17: 2 mg via INTRAVENOUS

## 2022-03-17 MED ORDER — 0.9 % SODIUM CHLORIDE (POUR BTL) OPTIME
TOPICAL | Status: DC | PRN
  Administered 2022-03-17: 1000 mL

## 2022-03-17 MED ORDER — SODIUM CHLORIDE 0.9% FLUSH: 3.0000 mL | Freq: Two times a day (BID) | INTRAVENOUS | Status: DC

## 2022-03-17 MED ORDER — HYDROMORPHONE HCL 1 MG/ML IJ SOLN
INTRAMUSCULAR | Status: AC
  Filled 2022-03-17: qty 1

## 2022-03-17 MED ORDER — FENTANYL CITRATE PF 50 MCG/ML IJ SOSY: 25.0000 ug | PREFILLED_SYRINGE | INTRAMUSCULAR | Status: DC | PRN

## 2022-03-17 MED ORDER — OXYCODONE HCL 5 MG/5ML PO SOLN: 5.0000 mg | Freq: Once | ORAL | Status: DC | PRN

## 2022-03-17 MED ORDER — KETOROLAC TROMETHAMINE 30 MG/ML IJ SOLN: 30.0000 mg | Freq: Once | INTRAMUSCULAR | Status: DC | PRN

## 2022-03-17 MED ORDER — CHLORHEXIDINE GLUCONATE 4 % EX LIQD: 60.0000 mL | Freq: Once | CUTANEOUS | Status: DC

## 2022-03-17 MED ORDER — ONDANSETRON HCL 4 MG/2ML IJ SOLN
INTRAMUSCULAR | Status: AC
  Filled 2022-03-17: qty 2

## 2022-03-17 MED ORDER — ACETAMINOPHEN 325 MG PO TABS: 650.0000 mg | ORAL_TABLET | ORAL | Status: DC | PRN

## 2022-03-17 MED ORDER — OXYCODONE HCL 5 MG PO TABS
ORAL_TABLET | ORAL | Status: AC
  Filled 2022-03-17: qty 2

## 2022-03-17 MED ORDER — PHENYLEPHRINE 80 MCG/ML (10ML) SYRINGE FOR IV PUSH (FOR BLOOD PRESSURE SUPPORT)
PREFILLED_SYRINGE | INTRAVENOUS | Status: DC | PRN
  Administered 2022-03-17 (×2): 80 ug via INTRAVENOUS

## 2022-03-17 MED ORDER — CEFAZOLIN SODIUM-DEXTROSE 2-4 GM/100ML-% IV SOLN
2.0000 g | INTRAVENOUS | Status: AC
  Administered 2022-03-17: 2 g via INTRAVENOUS
  Filled 2022-03-17: qty 100

## 2022-03-17 MED ORDER — HYDROMORPHONE HCL 1 MG/ML IJ SOLN: 0.2500 mg | INTRAMUSCULAR | Status: DC | PRN

## 2022-03-17 MED ORDER — ACETAMINOPHEN 500 MG PO TABS
1000.0000 mg | ORAL_TABLET | ORAL | Status: AC
  Administered 2022-03-17: 1000 mg via ORAL
  Filled 2022-03-17: qty 2

## 2022-03-17 MED ORDER — BUPIVACAINE-EPINEPHRINE 0.25% -1:200000 IJ SOLN
INTRAMUSCULAR | Status: DC | PRN
  Administered 2022-03-17: 30 mL

## 2022-03-17 MED ORDER — ONDANSETRON HCL 4 MG/2ML IJ SOLN: 4.0000 mg | Freq: Once | INTRAMUSCULAR | Status: DC | PRN

## 2022-03-17 MED ORDER — OXYCODONE HCL 5 MG PO TABS: 5.0000 mg | ORAL_TABLET | Freq: Three times a day (TID) | ORAL | 0 refills | Status: AC | PRN

## 2022-03-17 MED ORDER — MIDAZOLAM HCL 2 MG/2ML IJ SOLN
INTRAMUSCULAR | Status: AC
  Filled 2022-03-17: qty 2

## 2022-03-17 MED ORDER — ONDANSETRON HCL 4 MG/2ML IJ SOLN
INTRAMUSCULAR | Status: DC | PRN
  Administered 2022-03-17: 4 mg via INTRAVENOUS

## 2022-03-17 MED ORDER — DEXMEDETOMIDINE (PRECEDEX) IN NS 20 MCG/5ML (4 MCG/ML) IV SYRINGE
PREFILLED_SYRINGE | INTRAVENOUS | Status: DC | PRN
  Administered 2022-03-17: 12 ug via INTRAVENOUS

## 2022-03-17 MED ORDER — GABAPENTIN 300 MG PO CAPS
300.0000 mg | ORAL_CAPSULE | ORAL | Status: AC
  Administered 2022-03-17: 300 mg via ORAL
  Filled 2022-03-17: qty 1

## 2022-03-17 MED ORDER — DEXAMETHASONE SODIUM PHOSPHATE 10 MG/ML IJ SOLN
INTRAMUSCULAR | Status: AC
  Filled 2022-03-17: qty 1

## 2022-03-17 MED ORDER — OXYCODONE HCL 5 MG PO TABS: 5.0000 mg | ORAL_TABLET | ORAL | Status: DC | PRN

## 2022-03-17 MED ORDER — FENTANYL CITRATE (PF) 100 MCG/2ML IJ SOLN
INTRAMUSCULAR | Status: AC
  Filled 2022-03-17: qty 2

## 2022-03-17 MED ORDER — BUPIVACAINE LIPOSOME 1.3 % IJ SUSP: 20.0000 mL | Freq: Once | INTRAMUSCULAR | Status: DC

## 2022-03-17 MED ORDER — SODIUM CHLORIDE 0.9 % IV SOLN: 250.0000 mL | INTRAVENOUS | Status: DC | PRN

## 2022-03-17 MED ORDER — BUPIVACAINE LIPOSOME 1.3 % IJ SUSP
INTRAMUSCULAR | Status: DC | PRN
  Administered 2022-03-17: 20 mL

## 2022-03-17 MED ORDER — BUPIVACAINE LIPOSOME 1.3 % IJ SUSP
INTRAMUSCULAR | Status: AC
  Filled 2022-03-17: qty 20

## 2022-03-17 MED ORDER — BUPIVACAINE-EPINEPHRINE (PF) 0.25% -1:200000 IJ SOLN
INTRAMUSCULAR | Status: AC
  Filled 2022-03-17: qty 30

## 2022-03-17 MED ORDER — ACETAMINOPHEN 650 MG RE SUPP: 650.0000 mg | RECTAL | Status: DC | PRN

## 2022-03-17 MED ORDER — LIDOCAINE HCL (PF) 2 % IJ SOLN
INTRAMUSCULAR | Status: AC
  Filled 2022-03-17: qty 5

## 2022-03-17 MED ORDER — ORAL CARE MOUTH RINSE: 15.0000 mL | Freq: Once | OROMUCOSAL | Status: AC

## 2022-03-17 MED ORDER — LIDOCAINE 2% (20 MG/ML) 5 ML SYRINGE
INTRAMUSCULAR | Status: DC | PRN
  Administered 2022-03-17: 100 mg via INTRAVENOUS

## 2022-03-17 SURGICAL SUPPLY — 31 items
BAG COUNTER SPONGE SURGICOUNT (BAG) IMPLANT
BENZOIN TINCTURE PRP APPL 2/3 (GAUZE/BANDAGES/DRESSINGS) ×1 IMPLANT
CHLORAPREP W/TINT 26 (MISCELLANEOUS) ×2 IMPLANT
COVER SURGICAL LIGHT HANDLE (MISCELLANEOUS) ×2 IMPLANT
DRAPE LAPAROSCOPIC ABDOMINAL (DRAPES) ×2 IMPLANT
DRSG TEGADERM 4X4.75 (GAUZE/BANDAGES/DRESSINGS) ×1 IMPLANT
ELECT REM PT RETURN 15FT ADLT (MISCELLANEOUS) ×2 IMPLANT
GAUZE SPONGE 4X4 12PLY STRL (GAUZE/BANDAGES/DRESSINGS) ×1 IMPLANT
GLOVE BIO SURGEON STRL SZ 6 (GLOVE) ×2 IMPLANT
GLOVE INDICATOR 6.5 STRL GRN (GLOVE) ×2 IMPLANT
GLOVE SS BIOGEL STRL SZ 6 (GLOVE) ×1 IMPLANT
GLOVE SUPERSENSE BIOGEL SZ 6 (GLOVE) ×1
GOWN STRL REUS W/ TWL LRG LVL3 (GOWN DISPOSABLE) ×1 IMPLANT
GOWN STRL REUS W/ TWL XL LVL3 (GOWN DISPOSABLE) IMPLANT
GOWN STRL REUS W/TWL LRG LVL3 (GOWN DISPOSABLE) ×1
GOWN STRL REUS W/TWL XL LVL3 (GOWN DISPOSABLE)
KIT BASIN OR (CUSTOM PROCEDURE TRAY) ×2 IMPLANT
KIT TURNOVER KIT A (KITS) IMPLANT
NEEDLE HYPO 22GX1.5 SAFETY (NEEDLE) IMPLANT
PACK GENERAL/GYN (CUSTOM PROCEDURE TRAY) ×2 IMPLANT
PENCIL SMOKE EVACUATOR (MISCELLANEOUS) ×1 IMPLANT
SPIKE FLUID TRANSFER (MISCELLANEOUS) ×2 IMPLANT
STRIP CLOSURE SKIN 1/2X4 (GAUZE/BANDAGES/DRESSINGS) ×1 IMPLANT
SUT ETHIBOND 0 MO6 C/R (SUTURE) ×1 IMPLANT
SUT MNCRL AB 4-0 PS2 18 (SUTURE) ×2 IMPLANT
SUT PROLENE 2 0 CT2 30 (SUTURE) ×2 IMPLANT
SUT VIC AB 3-0 SH 27 (SUTURE) ×1
SUT VIC AB 3-0 SH 27XBRD (SUTURE) IMPLANT
SYR CONTROL 10ML LL (SYRINGE) IMPLANT
TOWEL OR 17X26 10 PK STRL BLUE (TOWEL DISPOSABLE) ×2 IMPLANT
TOWEL OR NON WOVEN STRL DISP B (DISPOSABLE) ×2 IMPLANT

## 2022-03-17 NOTE — Transfer of Care (Signed)
Immediate Anesthesia Transfer of Care Note  Patient: David Mckinney  Procedure(s) Performed: OPEN UMBILICAL HERNIA REPAIR  Patient Location: PACU  Anesthesia Type:General  Level of Consciousness: drowsy  Airway & Oxygen Therapy: Patient Spontanous Breathing and Patient connected to face mask oxygen  Post-op Assessment: Report given to RN and Post -op Vital signs reviewed and stable  Post vital signs: Reviewed and stable  Last Vitals:  Vitals Value Taken Time  BP 92/50 03/17/22 1025  Temp    Pulse 72 03/17/22 1026  Resp 17 03/17/22 1026  SpO2 95 % 03/17/22 1026  Vitals shown include unvalidated device data.  Last Pain:  Vitals:   03/17/22 0747  TempSrc:   PainSc: 0-No pain         Complications: No notable events documented.

## 2022-03-17 NOTE — Op Note (Signed)
Operative Note  David Mckinney [name]  [id number]  [id number]  03/17/2022   Surgeon: Phylliss Blakes MD FACS   Procedure performed: Open primary repair of incarcerated umbilical hernia, defect 8 mm   Preop diagnosis: Incarcerated umbilical hernia Post-op diagnosis/intraop findings: Same, containing preperitoneal fat   Specimens: no Retained items: no  EBL: minimal cc Complications: none   Description of procedure: After confirming informed consent the patient was taken to the operating room and placed supine on operating room table where general anesthesia was initiated, preoperative antibiotics were administered, SCDs applied, and a formal timeout was performed.  The abdomen was clipped, prepped and draped in usual sterile fashion.  After infiltration with local (Exparel mixed with quarter percent Marcaine with epinephrine), an infraumbilical curvilinear incision was made and the soft tissue dissected with cautery.  The umbilical stalk was encircled and the umbilical skin divided from the underlying hernia sac with cautery.  The hernia sac was skeletonized down to the level of the fascia where it was excised along with a small amount of chronically incarcerated preperitoneal fat.  The fascial defect was cleared off and the defect was measured at 8 mm in maximal diameter.  This was closed transversely with interrupted 0 Ethibonds.  Additional local was infiltrated in the fascia and soft tissue surrounding the repair.  Hemostasis was ensured within the wound.  The umbilical skin was sutured down to the fascia with a 3-0 Vicryl.  The skin was closed with running subcuticular 4 Monocryl and a light pressure dressing of gauze and Tegaderm was then applied. The patient was then awakened, extubated and taken to PACU in stable condition.    All counts were correct at the completion of the case.

## 2022-03-17 NOTE — Anesthesia Procedure Notes (Signed)
Procedure Name: LMA Insertion Date/Time: 03/17/2022 9:23 AM  Performed by: Wynonia Sours, CRNAPre-anesthesia Checklist: Patient identified, Emergency Drugs available, Suction available, Patient being monitored and Timeout performed Patient Re-evaluated:Patient Re-evaluated prior to induction Oxygen Delivery Method: Circle system utilized Preoxygenation: Pre-oxygenation with 100% oxygen Induction Type: IV induction LMA: LMA with gastric port inserted LMA Size: 4.0 Number of attempts: 1 Placement Confirmation: positive ETCO2 Tube secured with: Tape Dental Injury: Teeth and Oropharynx as per pre-operative assessment

## 2022-03-17 NOTE — H&P (Signed)
David Mckinney [id number]    Referring Provider:  Room, Emergency     Subjective    Chief Complaint: New Consultation (Umb. Hernia )       History of Present Illness:    Exceptionally pleasant 46 year old male with no known medical problems who was referred by the emergency department after presenting with a 3-day history of umbilical pain in mid April.  No associated emesis or fever.  Note umbilical hernia which was typically reducible but had become incarcerated at that time.  Pain worse with movement.  No previous abdominal surgery.  He did have a CT scan confirming a fat-containing incarcerated umbilical hernia with a narrow fascial defect about 8 mm.  Incidentally noted hepatic steatosis and simple hepatic and renal cysts, colonic diverticulosis. He states his hernia has been present for about 15 years, but has never bothered him until now.  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     Review of Systems: A complete review of systems was obtained from the patient.  I have reviewed this information and discussed as appropriate with the patient.  See HPI as well for other ROS.     Medical History: History reviewed. No pertinent past medical history.   There is no problem list on file for this patient.     History reviewed. No pertinent surgical history.    No Known Allergies   No current outpatient medications on file prior to visit.    No current facility-administered medications on file prior to visit.      No family history on file.    Social History       Tobacco Use  Smoking Status Never  Smokeless Tobacco Never      Social History        Socioeconomic History   Marital status: Married  Tobacco Use   Smoking status: Never   Smokeless tobacco: Never  Substance and Sexual Activity   Alcohol use: Not Currently   Drug use: Never      Objective:          Vitals:    02/10/22 1544  BP: 120/70  Pulse: 71  Temp: 36.2 C (97.2 F)  SpO2: 98%  Weight: 75 kg (165 lb 6.4 oz)  Height: 156.2 cm (5' 1.5")    Body mass index is 30.75 kg/m.   A&Ox3 Unlabored respirations Abdomen soft and nontender.  There is a partially reducible but essentially incarcerated fat-containing umbilical hernia.  This is mildly tender.  Fascial defect is not palpable.   Assessment and Plan:  Diagnoses and all orders for this visit:   Umbilical hernia without obstruction and without gangrene     Recommended open repair, discussed technique of surgery and risks of bleeding, infection, pain, scarring, injury to intra-abdominal structures, wound healing problems, hematoma/seroma, hernia recurrence, possible use of mesh, as well as systemic cardiovascular/pulmonary/thromboembolic complications.  Discussed 6 weeks of restricted lifting postop.  Questions welcomed and answered.  We will schedule the patient's convenience.     [name] [name], MD

## 2022-03-17 NOTE — Anesthesia Postprocedure Evaluation (Signed)
Anesthesia Post Note  Patient: David Mckinney  Procedure(s) Performed: OPEN UMBILICAL HERNIA REPAIR     Patient location during evaluation: PACU Anesthesia Type: General Level of consciousness: awake and alert Pain management: pain level controlled Vital Signs Assessment: post-procedure vital signs reviewed and stable Respiratory status: spontaneous breathing, nonlabored ventilation, respiratory function stable and patient connected to nasal cannula oxygen Cardiovascular status: blood pressure returned to baseline and stable Postop Assessment: no apparent nausea or vomiting Anesthetic complications: no   No notable events documented.  Last Vitals:  Vitals:   03/17/22 1045 03/17/22 1100  BP: (!) 91/54 104/63  Pulse: 69 69  Resp: 15 13  Temp:  36.4 C  SpO2: 98% 98%    Last Pain:  Vitals:   03/17/22 1115  TempSrc:   PainSc: 0-No pain                 Trevor Iha

## 2022-03-17 NOTE — Discharge Instructions (Addendum)
[facility] REPAIR: POST OP INSTRUCTIONS   EAT Gradually transition to a high fiber diet with a fiber supplement over the next few weeks after discharge.  Start with a pureed / full liquid diet (see below)  WALK Walk an hour a day (cumulative- not all at once).  Control your pain to do that.    CONTROL PAIN Control pain so that you can walk, sleep, tolerate sneezing/coughing, and go up/down stairs.  HAVE A BOWEL MOVEMENT DAILY Keep your bowels regular to avoid problems.  OK to try a laxative to override constipation.  OK to use an antidairrheal to slow down diarrhea.  Call if not better after 2 tries  CALL IF YOU HAVE PROBLEMS/CONCERNS Call if you are still struggling despite following these instructions. Call if you have concerns not answered by these instructions  ######################################################################    DIET: Follow a light bland diet & liquids the first 24 hours after arrival home, such as soup, liquids, starches, etc.  Be sure to drink plenty of fluids.  Quickly advance to a usual solid diet within a few days.  Avoid fast food or heavy meals as your are more likely to get nauseated or have irregular bowels.  A low-sugar, high-fiber diet for the rest of your life is ideal.   Take your usually prescribed home medications unless otherwise directed.  PAIN CONTROL: Pain is best controlled by a usual combination of three different methods TOGETHER: Ice/Heat Over the counter pain medication Prescription pain medication Most patients will experience some swelling and bruising around the hernia(s) such as the bellybutton, groins, or old incisions.  Ice packs or heating pads (30-60 minutes up to 6 times a day) will help. Use ice for the first few days to help decrease swelling and bruising, then switch to heat to help relax tight/sore spots and speed recovery.  Some people prefer to use ice alone, heat alone, alternating between ice & heat.  Experiment to what  works for you.  Swelling and bruising can take several weeks to resolve.   It is helpful to take an over-the-counter pain medication regularly for the first days: Naproxen (Aleve, etc)  Two 220mg tabs twice a day OR Ibuprofen (Advil, etc) Three 200mg tabs four times a day (every meal & bedtime) AND Acetaminophen (Tylenol, etc) 325-650mg four times a day (every meal & bedtime) A  prescription for pain medication should be given to you upon discharge.  Take your pain medication as prescribed, IF NEEDED.  If you are having problems/concerns with the prescription medicine (does not control pain, nausea, vomiting, rash, itching, etc), please call us [phone] to see if we need to switch you to a different pain medicine that will work better for you and/or control your side effect better. If you need a refill on your pain medication, please contact your pharmacy.  They will contact our office to request authorization. Prescriptions will not be filled after 5 pm or on week-ends.  Avoid getting constipated.  Between the surgery and the pain medications, it is common to experience some constipation.  Increasing fluid intake and taking a fiber supplement (such as Metamucil, Citrucel, FiberCon, MiraLax, etc) 1-2 times a day regularly will usually help prevent this problem from occurring.  A mild laxative (prune juice, Milk of Magnesia, MiraLax, etc) should be taken according to package directions if there are no bowel movements after 48 hours.    Wash / shower every day, starting 2 days after surgery.  You may shower over the   steri strips which are waterproof.    Remove your outer bandage 2 days after surgery. Steri strips will peel off after 1-2 weeks.  You may leave the incision open to air.  You may replace a dressing/Band-Aid to cover an incision for comfort if you wish.  Continue to shower over incision(s) after the dressing is off.  ACTIVITIES as tolerated:   You may resume regular (light) daily  activities beginning the next day--such as daily self-care, walking, climbing stairs--gradually increasing activities as tolerated.  Control your pain so that you can walk an hour a day.  If you can walk 30 minutes without difficulty, it is safe to try more intense activity such as jogging, treadmill, bicycling, low-impact aerobics, swimming, etc. Refrain from the most intensive and strenuous activity such as sit-ups, heavy lifting, contact sports, etc  Refrain from any heavy lifting or straining until 6 weeks after surgery.   It is best if you can remain out of work and rest for at least 2 weeks after surgery, and when you go back do not lift anything greater than 20lb for another 4 weeks.  DO NOT PUSH THROUGH PAIN.  Let pain be your guide: If it hurts to do something, don't do it.  Pain is your body warning you to avoid that activity for another week until the pain goes down. You may drive when you are no longer taking prescription pain medication, you can comfortably wear a seatbelt, and you can safely maneuver your car and apply brakes. You may have sexual intercourse when it is comfortable.   FOLLOW UP in our office Please call [facility] at [phone] to set up an appointment to see your surgeon in the office for a follow-up appointment approximately 2-3 weeks after your surgery. Make sure that you call for this appointment the day you arrive home to insure a convenient appointment time.  9.  If you have disability of FMLA / Family leave forms, please bring the forms to the office for processing.  (do not give to your surgeon).  WHEN TO CALL us [phone]: Poor pain control Reactions / problems with new medications (rash/itching, nausea, etc)  Fever over 101.5 F (38.5 C) Inability to urinate Nausea and/or vomiting Worsening swelling or bruising Continued bleeding from incision. Increased pain, redness, or drainage from the incision   The clinic staff is available to answer your  questions during regular business hours (8:30am-5pm).  Please don't hesitate to call and ask to speak to one of our nurses for clinical concerns.   If you have a medical emergency, go to the nearest emergency room or call 911.  A surgeon from [facility] is always on call at the hospitals in [facility], [place] [address], [address], [place], [place]  [address] ?  [address] [address], [place], [place]   [address] MAIN: [phone] ? TOLL FREE: [phone] ? FAX: [phone] [email or web address]

## 2022-03-18 ENCOUNTER — Encounter (HOSPITAL_COMMUNITY): Payer: Self-pay | Admitting: Surgery

## 2023-03-31 IMAGING — CT CT ABD-PELV W/ CM
2 of 5 series · 15 of 46 positions shown, 17 images · IV contrast (agent unspecified)
Comparison: None.

CLINICAL DATA: Epigastric pain and nausea.

EXAM:
CT ABDOMEN AND PELVIS WITH CONTRAST
TECHNIQUE: Multidetector CT imaging of the abdomen and pelvis was performed
using the standard protocol following bolus administration of
intravenous contrast.

[Series 2: axial st · axial · 0.81mm/px · z∈[+1184,+1619]mm · 12 of 103 slices shown, 14 images]
[im 8/103  soft-tissue]
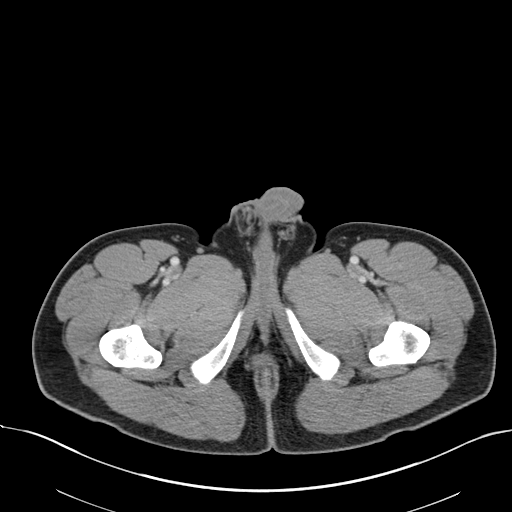
[im 8/103  bone]
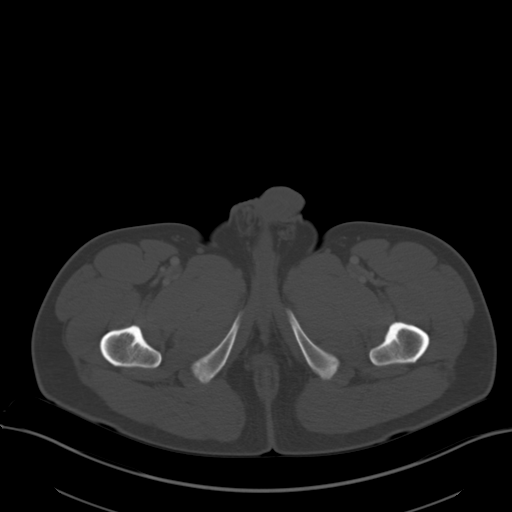
[im 16/103  soft-tissue]
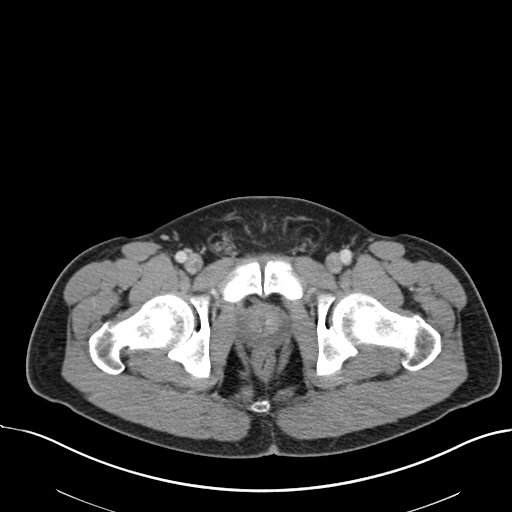
[im 24/103  soft-tissue]
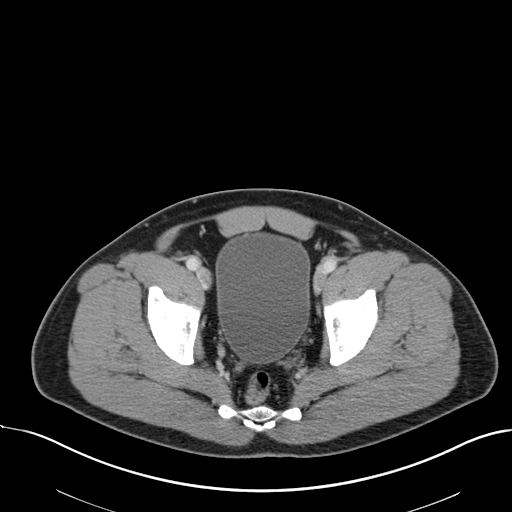
[im 32/103  soft-tissue]
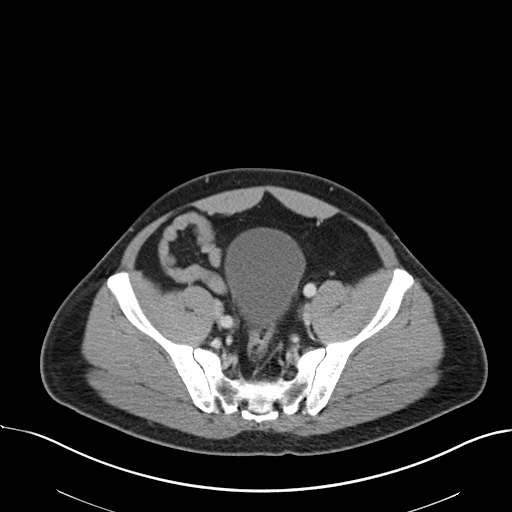
[im 40/103  soft-tissue]
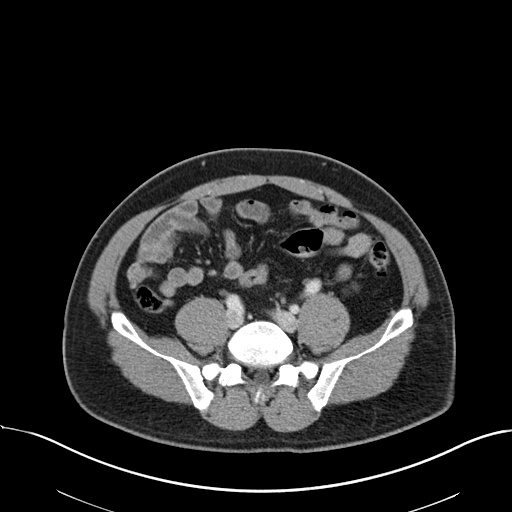
[im 48/103  soft-tissue]
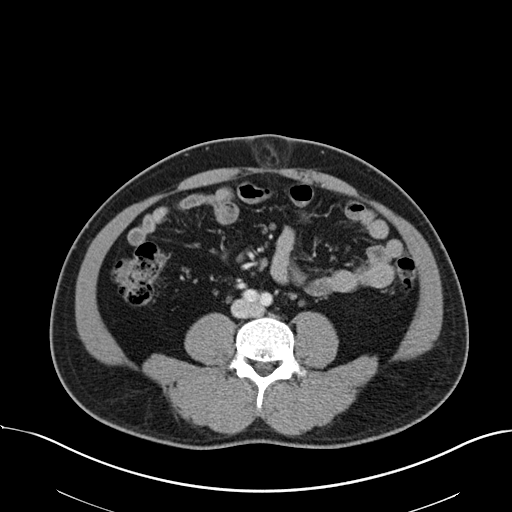
[im 55/103  soft-tissue]
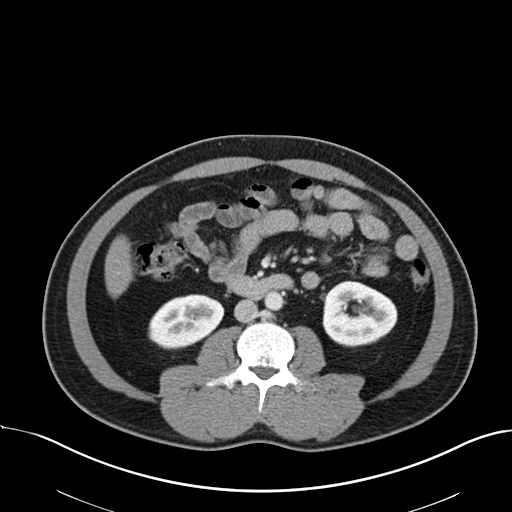
[im 63/103  soft-tissue]
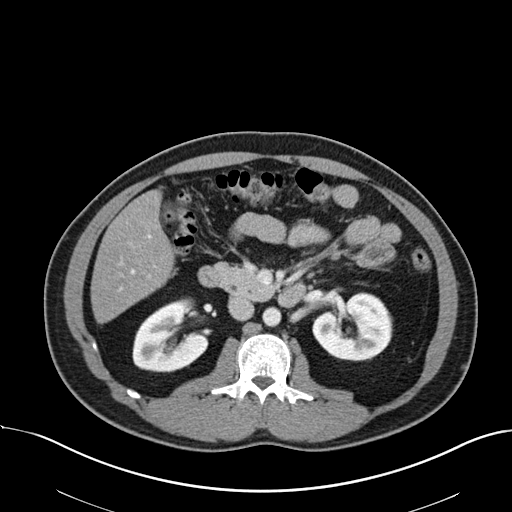
[im 71/103  soft-tissue]
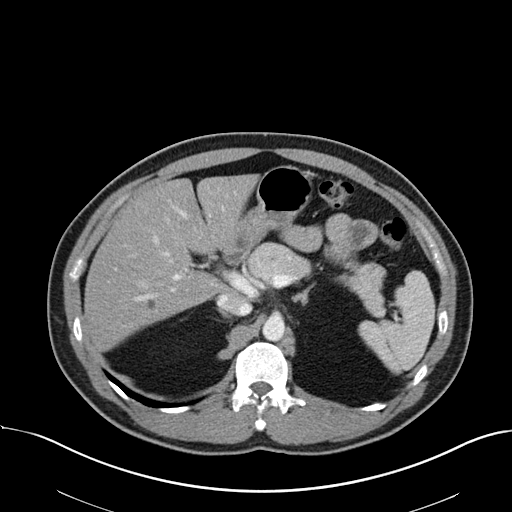
[im 71/103  bone]
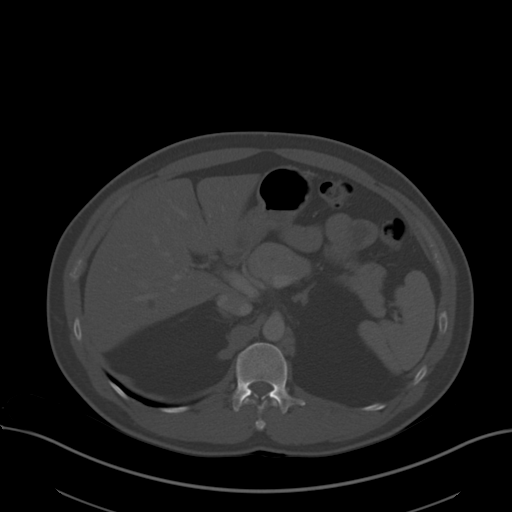
[im 79/103  soft-tissue]
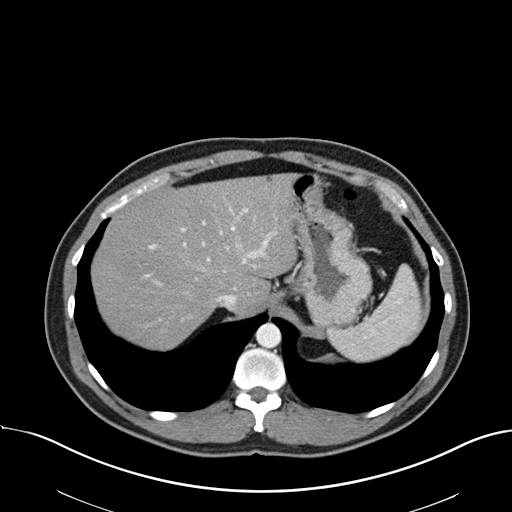
[im 87/103  soft-tissue]
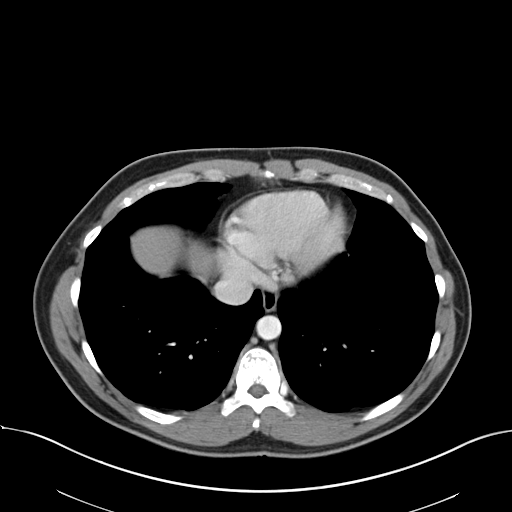
[im 95/103  soft-tissue]
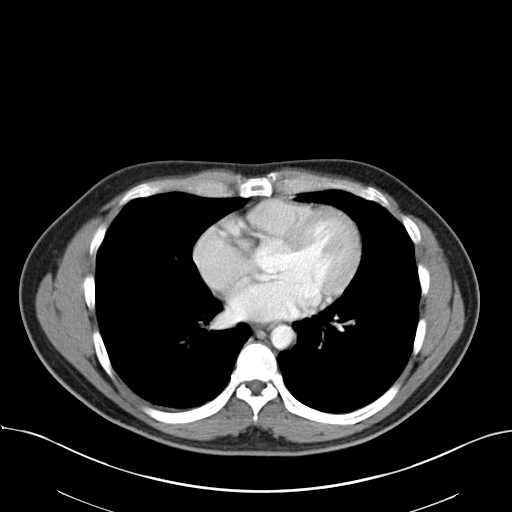

[Series 5: coronal st · coronal · 0.83mm/px · 3 of 135 slices shown]
[im 45/135  soft-tissue]
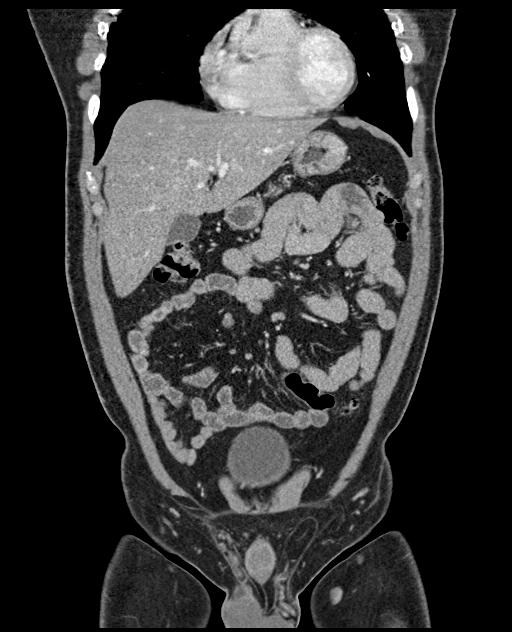
[im 60/135  soft-tissue]
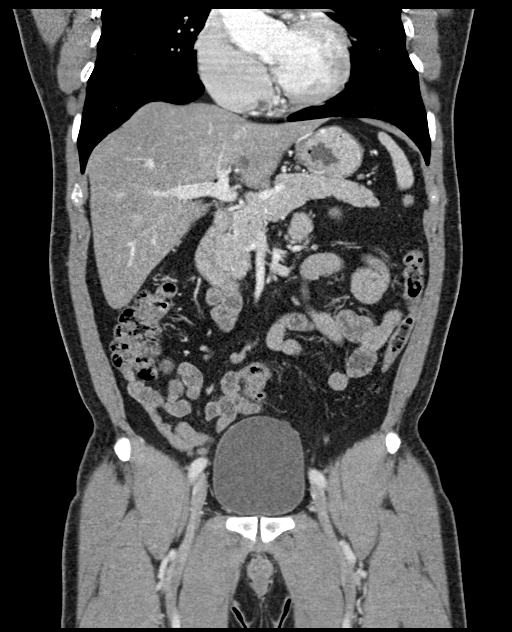
[im 75/135  soft-tissue]
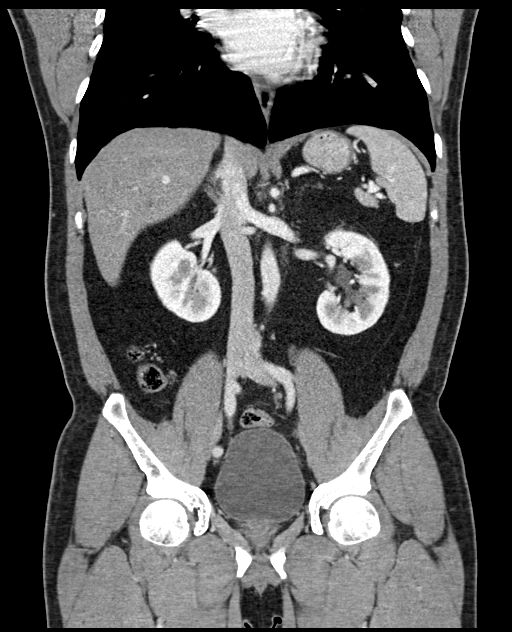

[15 of 46 positions shown; findings below may reference images not displayed]

RADIATION DOSE REDUCTION: This exam was performed according to the
departmental dose-optimization program which includes automated
exposure control, adjustment of the mA and/or kV according to
patient size and/or use of iterative reconstruction technique.

CONTRAST:  100mL OMNIPAQUE IOHEXOL 300 MG/ML  SOLN
FINDINGS: Lower chest: No acute abnormality.

Hepatobiliary: There is diffuse fatty infiltration of the liver
parenchyma. 8 mm and 9 mm cystic appearing areas are seen within the
right and left lobes. No gallstones, gallbladder wall thickening, or
biliary dilatation.

Pancreas: Unremarkable. No pancreatic ductal dilatation or
surrounding inflammatory changes.

Spleen: Normal in size without focal abnormality.

Adrenals/Urinary Tract: Adrenal glands are unremarkable. Kidneys are
normal in size, without renal calculi or hydronephrosis. A
subcentimeter simple cyst is seen within the upper pole of the right
kidney. No additional follow-up or imaging is recommended. Bladder
is unremarkable.

Stomach/Bowel: Stomach is within normal limits. Appendix appears
normal. No evidence of bowel wall thickening, distention, or
inflammatory changes. Noninflamed diverticula are seen throughout
the large bowel.

Vascular/Lymphatic: No significant vascular findings are present. No
enlarged abdominal or pelvic lymph nodes.

Reproductive: Prostate is unremarkable.

Other: A 2.8 cm x 3.1 cm fat containing umbilical hernia is noted.
Mild amount of associated inflammatory fat stranding is seen. No
abdominopelvic ascites.

Musculoskeletal: No acute or significant osseous findings.
IMPRESSION: 1. 2.8 cm x 3.1 cm fat containing umbilical hernia.
2. Hepatic steatosis.
3. Small simple hepatic and renal cysts.
4. Colonic diverticulosis.
